# Patient Record
Sex: Male | Born: 1955 | Race: White | Hispanic: No | Marital: Married
Health system: Southern US, Community
[De-identification: ages and names within clinical notes are randomized; demographics above are authoritative.]

---

## 2013-08-30 ENCOUNTER — Inpatient Hospital Stay: Payer: Self-pay | Admitting: Internal Medicine

## 2013-08-30 LAB — CBC WITH DIFFERENTIAL/PLATELET
BASOS ABS: 0 10*3/uL (ref 0.0–0.1)
Basophil %: 0.2 %
EOS ABS: 0 10*3/uL (ref 0.0–0.7)
Eosinophil %: 0.3 %
HCT: 44.9 % (ref 40.0–52.0)
HGB: 15.2 g/dL (ref 13.0–18.0)
LYMPHS PCT: 13.2 %
Lymphocyte #: 1.8 10*3/uL (ref 1.0–3.6)
MCH: 30.7 pg (ref 26.0–34.0)
MCHC: 33.9 g/dL (ref 32.0–36.0)
MCV: 90 fL (ref 80–100)
MONO ABS: 1 x10 3/mm (ref 0.2–1.0)
Monocyte %: 7.6 %
NEUTROS ABS: 10.9 10*3/uL — AB (ref 1.4–6.5)
Neutrophil %: 78.7 %
Platelet: 309 10*3/uL (ref 150–440)
RBC: 4.97 10*6/uL (ref 4.40–5.90)
RDW: 13.9 % (ref 11.5–14.5)
WBC: 13.8 10*3/uL — ABNORMAL HIGH (ref 3.8–10.6)

## 2013-08-30 LAB — URINALYSIS, COMPLETE
BLOOD: NEGATIVE
Bacteria: NONE SEEN
Bilirubin,UR: NEGATIVE
Glucose,UR: NEGATIVE mg/dL (ref 0–75)
Hyaline Cast: 4
KETONE: NEGATIVE
Leukocyte Esterase: NEGATIVE
NITRITE: NEGATIVE
PH: 5 (ref 4.5–8.0)
Protein: NEGATIVE
RBC,UR: 1 /HPF (ref 0–5)
Specific Gravity: 1.019 (ref 1.003–1.030)
Squamous Epithelial: NONE SEEN

## 2013-08-30 LAB — COMPREHENSIVE METABOLIC PANEL
ALBUMIN: 4.5 g/dL (ref 3.4–5.0)
ALK PHOS: 32 U/L — AB
ALT: 22 U/L (ref 12–78)
AST: 14 U/L — AB (ref 15–37)
Anion Gap: 5 — ABNORMAL LOW (ref 7–16)
BUN: 22 mg/dL — ABNORMAL HIGH (ref 7–18)
Bilirubin,Total: 0.5 mg/dL (ref 0.2–1.0)
CALCIUM: 10 mg/dL (ref 8.5–10.1)
Chloride: 103 mmol/L (ref 98–107)
Co2: 30 mmol/L (ref 21–32)
Creatinine: 1.27 mg/dL (ref 0.60–1.30)
Glucose: 114 mg/dL — ABNORMAL HIGH (ref 65–99)
OSMOLALITY: 280 (ref 275–301)
POTASSIUM: 4.3 mmol/L (ref 3.5–5.1)
Sodium: 138 mmol/L (ref 136–145)
Total Protein: 7.8 g/dL (ref 6.4–8.2)

## 2013-08-30 LAB — LIPID PANEL
Cholesterol: 125 mg/dL (ref 0–200)
HDL: 34 mg/dL — AB (ref 40–60)
Ldl Cholesterol, Calc: 66 mg/dL (ref 0–100)
Triglycerides: 125 mg/dL (ref 0–200)
VLDL Cholesterol, Calc: 25 mg/dL (ref 5–40)

## 2013-08-30 LAB — TROPONIN I: Troponin-I: <0.02 ng/mL

## 2013-08-30 LAB — LIPASE, BLOOD: LIPASE: 3477 U/L — AB (ref 73–393)

## 2013-08-30 LAB — HEMOGLOBIN A1C: HEMOGLOBIN A1C: 5.5 % (ref 4.2–6.3)

## 2013-08-31 LAB — COMPREHENSIVE METABOLIC PANEL
ALK PHOS: 26 U/L — AB
ANION GAP: 4 — AB (ref 7–16)
Albumin: 3.5 g/dL (ref 3.4–5.0)
BILIRUBIN TOTAL: 0.4 mg/dL (ref 0.2–1.0)
BUN: 19 mg/dL — ABNORMAL HIGH (ref 7–18)
CHLORIDE: 108 mmol/L — AB (ref 98–107)
Calcium, Total: 9.1 mg/dL (ref 8.5–10.1)
Co2: 27 mmol/L (ref 21–32)
Creatinine: 1.23 mg/dL (ref 0.60–1.30)
GLUCOSE: 87 mg/dL (ref 65–99)
OSMOLALITY: 279 (ref 275–301)
Potassium: 4.3 mmol/L (ref 3.5–5.1)
SGOT(AST): 22 U/L (ref 15–37)
SGPT (ALT): 17 U/L (ref 12–78)
SODIUM: 139 mmol/L (ref 136–145)
TOTAL PROTEIN: 6.5 g/dL (ref 6.4–8.2)

## 2013-08-31 LAB — CBC WITH DIFFERENTIAL/PLATELET
Basophil #: 0.1 10*3/uL (ref 0.0–0.1)
Basophil %: 0.4 %
Eosinophil #: 0.1 10*3/uL (ref 0.0–0.7)
Eosinophil %: 0.6 %
HCT: 39.8 % — ABNORMAL LOW (ref 40.0–52.0)
HGB: 13.7 g/dL (ref 13.0–18.0)
LYMPHS ABS: 2.3 10*3/uL (ref 1.0–3.6)
LYMPHS PCT: 18.1 %
MCH: 30.9 pg (ref 26.0–34.0)
MCHC: 34.3 g/dL (ref 32.0–36.0)
MCV: 90 fL (ref 80–100)
MONOS PCT: 8.4 %
Monocyte #: 1.1 x10 3/mm — ABNORMAL HIGH (ref 0.2–1.0)
Neutrophil #: 9.1 10*3/uL — ABNORMAL HIGH (ref 1.4–6.5)
Neutrophil %: 72.5 %
Platelet: 244 10*3/uL (ref 150–440)
RBC: 4.42 10*6/uL (ref 4.40–5.90)
RDW: 13.7 % (ref 11.5–14.5)
WBC: 12.6 10*3/uL — ABNORMAL HIGH (ref 3.8–10.6)

## 2013-08-31 LAB — LIPASE, BLOOD: Lipase: 3826 U/L — ABNORMAL HIGH (ref 73–393)

## 2013-08-31 LAB — TROPONIN I: Troponin-I: <0.02 ng/mL

## 2013-09-01 LAB — LIPID PANEL
CHOLESTEROL: 95 mg/dL (ref 0–200)
HDL Cholesterol: 30 mg/dL — ABNORMAL LOW (ref 40–60)
Ldl Cholesterol, Calc: 40 mg/dL (ref 0–100)
Triglycerides: 123 mg/dL (ref 0–200)
VLDL Cholesterol, Calc: 25 mg/dL (ref 5–40)

## 2013-09-01 LAB — CANCER ANTIGEN 19-9: CA 19 9: 6 U/mL (ref 0–35)

## 2013-09-01 LAB — LIPASE, BLOOD: LIPASE: 244 U/L (ref 73–393)

## 2013-09-02 LAB — OCCULT BLOOD X 1 CARD TO LAB, STOOL: OCCULT BLOOD, FECES: NEGATIVE

## 2013-09-03 LAB — PLATELET COUNT: Platelet: 260 10*3/uL (ref 150–440)

## 2013-09-11 LAB — PATHOLOGY REPORT

## 2014-03-30 ENCOUNTER — Ambulatory Visit: Payer: Self-pay | Admitting: Unknown Physician Specialty

## 2014-04-04 LAB — PATHOLOGY REPORT

## 2014-05-07 ENCOUNTER — Ambulatory Visit: Payer: Self-pay | Admitting: Internal Medicine

## 2014-05-21 ENCOUNTER — Ambulatory Visit: Payer: Self-pay | Admitting: Internal Medicine

## 2014-08-22 ENCOUNTER — Other Ambulatory Visit: Payer: Self-pay | Admitting: Neurosurgery

## 2014-08-22 DIAGNOSIS — M5416 Radiculopathy, lumbar region: Secondary | ICD-10-CM

## 2014-11-03 NOTE — Discharge Summary (Signed)
PATIENT NAME:  Sean Braun, Sean Braun MR#:  [id number] DATE OF BIRTH:  1955/11/04  DATE OF ADMISSION:  08/30/2013  DATE OF DISCHARGE:  09/03/2013  DISCHARGE DIAGNOSES: 1.  Abdominal pain secondary to acute pancreatitis, resolved.  2.  The patient has abdominal pain likely secondary to gastritis and duodenal ulcer as well, resolved.  3.  Hypertension.  4.  Hyperlipidemia.  5.  Coronary artery disease, CABG. 6.  History of sciatica.   DISCHARGE MEDICATIONS:  1.  Trilipix 135 mg p.o. daily.  2.  Lisinopril 15 mg p.o. daily.  3.  Zocor 20 mg p.o. daily.  4.  Atenolol 50 mg p.o. daily.  5.  Fish oil 1 gram p.o. daily.  6.  Sucralfate 1 gram p.o. 4 times a day.  7.  Protonix 40 mg p.o. b.i.d.   Sucralfate and Protonix are new medications. Advised to stop aspirin and other NSAID-containing products.   DIET: Low-sodium, low-fat diet.   CONSULTATIONS: GI consult with Dr. Bluford Kaufmannh and also Dr. Servando SnareWohl.    LAB DATA: EKG on admission showed normal sinus rhythm with no ST-T changes. Hemoglobin A1c 5.5. The patient's triglycerides were 125. Lipase was 3477 on admission. Abdominal ultrasound was normal. Lipase improved to normal on February 20. Abdominal CAT scan showed edema of the second portion of duodenum, with duodenitis.   HOSPITAL COURSE:  The patient is a [age] year old male with history of coronary artery disease and CABG. Came in because of abdominal pain, which was worse after eating. The patient found to have acute pancreatitis. The patient was kept n.p.o., started on IV fluids, and patient did get IV Zofran and seen by GI. The patient's ultrasound and CT abdomen were unremarkable. The patient's lipase improved, and pancreatitis resolved the next day. The patient's CAT scan showed duodenitis, so he had an EGD, which showed multiple duodenal ulcers and gastritis. Started on IV PPI, and then changed to p.o. PPI and (carafate. The patient was advised to stop Advil and also other NSAID products, and she is  tolerating the diet and has no more abdominal pain. The patient is stable for discharge today.   VITAL SIGNS:  Temperature 97.5, heart rate 55, blood pressure 130/80, sat 94% on room air.   Time spent on discharge preparation: More than 30 minutes.   The patient has no primary doctor, as he [place] from [place] recently. I advised him to get a primary doctor as soon as possible.    ____________________________ Katha HammingSnehalatha Derral Colucci, MD sk:mr D: 09/03/2013 13:48:40 ET T: 09/03/2013 21:00:41 ET JOB#: [id number]  cc: Katha HammingSnehalatha Jamond Neels, MD, <Dictator> Katha HammingSNEHALATHA Wonder Donaway MD ELECTRONICALLY SIGNED 09/07/2013 14:05

## 2014-11-03 NOTE — Consult Note (Signed)
PATIENT NAME:  Sean FramesGNANDT, Jshaun J MR#:  [id number] DATE OF BIRTH:  October 05, 1955  DATE OF CONSULTATION:  08/31/2013  REFERRING PHYSICIAN:  Katharina Caperima Vaickute, MD CONSULTING PROVIDERS:  Lynnae Prudeobert Elliott, MD, and Ranae PlumberKimberly A. Arvilla MarketMills, ANP (Adult Nurse Practitioner)  REASON FOR CONSULTATION: Pancreatitis, unclear etiology.   HISTORY OF PRESENT ILLNESS: This 59 year old patient has a history of hypertension, hyperlipidemia as well as coronary artery disease, and presented with a 2-day history of upper abdomen pain. It started out 2/10 by intensity and worsened to 10/10 by intensity. He describes this as an achiness radiating all over the abdomen, usually associated with eating. He had nausea without vomiting. He has passed green stools, no blood or melena noted. He denies fevers or chills.   In the Emergency Room, he was found to have a lipase greater than 3000. He denies any alcohol abuse or  use. No history of gallbladder disease. Liver enzymes were normal. Abdominal ultrasound showed no bile duct dilatation. Triglycerides levels were normal. GI has been asked to see the patient regarding further evaluation and management.   The patient does take Advil at night. He has done this chronically for years. Typically he has no heartburn or indigestion complaint.   PAST MEDICAL HISTORY:  1.  CAD status post CABG.  2.  Hypertension.  3.  Hyperlipidemia.  4.  Hypertriglyceridemia.   MEDICATIONS FROM HOME:  1.  Aspirin 81 mg daily.  2.  Atenolol 50 mg daily.  3.  Fish oil 1 g once daily.  4.  Lisinopril 50 mg daily.  5.  Trilipix   35 mg daily.  6.  Vitamin C 500 mg daily.  7.  Zocor 10 mg at bedtime.   PAST SURGICAL HISTORY:  1.  L4-L5 lower back surgery, diskectomy as well as laminectomy in the remote past with resolving sciatica in the right lower extremity.  2.  Coronary artery bypass graft, two stents placed status post MI in 1999.  3.  Chest pain, 2008. Cardiac cath with two more blockages. Underwent CABG.  Triple bypass performed in 2008.   ALLERGIES: NKDA.   FAMILY HISTORY: Positive for heart disease, hypertension, and diabetes. A mother deceased with COPD complications. Father had heart disease.   SOCIAL HISTORY: The patient is married, lives with his wife. Family is in [place]. Negative tobacco, past or present or alcohol, past or present.   REVIEW OF SYSTEMS: Positive for GI symptoms as noted. He does have chronic sciatica pain for which he takes NSAIDs.   PHYSICAL EXAMINATION:  VITAL SIGNS: stable GENERAL: Well-developed, well-nourished, large-framed Caucasian male sitting in bed, NAD. HEENT: Default negative.  NECK: Supple. Trachea midline.  HEART: Heart tones S1, S2 without murmur, rub, or gallop.  LUNGS: CTA. Respirations are nonlabored.  ABDOMEN: Soft, protuberant. Positive tenderness, right upper quadrant and epigastric area. This is significant tenderness and was not able to palpate for hepatosplenomegaly.  RECTAL: Deferred.  MUSCULOSKELETAL: Moves all extremities. Gait not evaluated.  SKIN: Warm and dry without rash.  NEUROLOGIC: Cranial nerves II through XII grossly intact. The patient is a good historian.  PSYCHIATRIC: Affect and mood within normal.   LABORATORIES AND STUDIES: Admission blood work with glucose 114, BUN 22, creatinine 1.27. Lipase 3477, today 3826. Albumin 4.5, alkaline phosphatase 32, AST 14, ALT 22. Troponin less than 0.02. WBC 13.8, hemoglobin 15.2 with hematocrit 44.9. Hematocrit down 39.8 today. WBC 12.6.   Laboratory study, 08/31/2013: Today shows creatinine is 1.23, lipase 3826. Liver panel unchanged. WBC 12.6, hematocrit 39.8, which  is good.   URINALYSIS: Clear, yellow, positive hyaline casts. Negative for blood or ketones.   RADIOLOGY: CT of the abdomen and pelvis with contrast performed 08/31/2013 shows prominent edema in the second portion of the duodenum with very subtle soft-tissue stranding around the uncinate process of the pancreas. There is no  definable ulceration. The remainder of the pancreas is normal. The liver and biliary tree are normal. Spleen, adrenal glands, and kidneys are normal except for a 12 mm cyst in the left kidney. Lung bases are clear.   IMPRESSION: Acute right upper quadrant pain with epigastric distribution with elevated lipase suggesting pancreatitis. CT study does show abnormalities around the duodenum and this duodenitis could also represent a duodenal ulcer. He has chronic nonsteroidal antiinflammatory  use. Normal abdominal ultrasound. No gallstones or abnormal bile ducts.   PLAN: Etiology shows pancreatitis in a life-long nonalcohol user with adequate lipid panel. Suggest rule out duodenal ulcer or  erosion. Would recommend EGD in the morning. If negative, consider EUS to rule out pancreatic malignancy. Dr. Mechele Collin does recommend CA19-9 today.   It is possible to have a gallstone come and go without biliary abnormalities, so gallstone pancreatitis is still potentially on the list. There is concern for duodenal ulcer.   These services provided by Amedeo Kinsman, ANP, under collaborative agreement with Dr. Lynnae Prude.   Thank you for the consultation.    ____________________________ Ranae Plumber Arvilla Market, ANP (Adult Nurse Practitioner) kam:np D: 09/26/2013 13:44:48 ET T: 09/26/2013 15:39:06 ET JOB#: [id number]  cc: Cala Bradford A. Arvilla Market, ANP (Adult Nurse Practitioner), <Dictator> Ranae Plumber Suzette Battiest, MSN, ANP-BC Adult Nurse Practitioner ELECTRONICALLY SIGNED 09/26/2013 17:39

## 2014-11-03 NOTE — Consult Note (Signed)
Consult done by NP Fransico SettersKim Mills, discussed with her, I also saw the patient. Plan for EGD tomorrow due to CT showing ulcerated duodenum.  May be due to nocturnal Advil at bed time.    Electronic Signatures: Scot JunElliott, Chyane Greer T (MD)  (Signed on 19-Feb-15 16:42)  Authored  Last Updated: 19-Feb-15 16:42 by Scot JunElliott, Wesson Stith T (MD)

## 2014-11-03 NOTE — Consult Note (Signed)
Brief Consult Note: Diagnosis: Acute RUQ/epi pain with pancreatitis, chronic NSAID, Duodentitis on CT, normal abd US-  no gallstones or abnormal bile ducts.   Patient was seen by consultant.   Consult note dictated.   Comments: Etiology of acute pancreatitis in a lifelong non alcohol user to r/o duo ulcer/erosion. Perform EGD in am. If negative, consider EUS to r/o pancareatic malignancy. Obtain CA 19-9 today. It is possible to have a gallstone come and go without biliary abnormality. Check fasting lipids. This case was discussed with Dr. Mechele CollinElliott in collaboration of care.  Electronic Signatures: Sean Braun, Sean Braun (NP)  (Signed 19-Feb-15 16:14)  Authored: Brief Consult Note   Last Updated: 19-Feb-15 16:14 by Sean Braun, Ananias Kolander Braun (NP)

## 2014-11-03 NOTE — Consult Note (Signed)
Pt with scattered erosions in antrum, severe ulcerations in mid and distal duodenal bulb.  Likely he passed a stone causing lipase to fall dramaticly.  Needs 1-2 days more in hospital with carafate and PPI and clear liq diet which can be advanced to full liquids after 24 hours or so.  Dr. Servando SnareWohl on call this weekend for me.  Bx done of stomach for H, pylori.  Electronic Signatures: Scot JunElliott, Robert T (MD)  (Signed on 20-Feb-15 13:56)  Authored  Last Updated: 20-Feb-15 13:56 by Scot JunElliott, Robert T (MD)

## 2014-11-03 NOTE — Consult Note (Signed)
Chief Complaint:  Subjective/Chief Complaint Patient with no complaints. S/P EGD with duodenal ulcer and pancreatitis. He was taking NSAID's prior to admission. No sign of any GI bleeding.   VITAL SIGNS/ANCILLARY NOTES: **Vital Signs.:   21-Feb-15 05:20  Vital Signs Type Routine  Temperature Temperature (F) 97.6  Celsius 36.4  Temperature Source oral  Pulse Pulse 63  Respirations Respirations 17  Systolic BP Systolic BP 119  Diastolic BP (mmHg) Diastolic BP (mmHg) 68  Mean BP 85  Pulse Ox % Pulse Ox % 94  Pulse Ox Activity Level  At rest  Oxygen Delivery Room Air/ 21 %   Brief Assessment:  GEN well developed, well nourished   Respiratory normal resp effort   Additional Physical Exam Alert and orientated times 3   Lab Results: Routine Chem:  20-Feb-15 06:20   Lipase 244 (Result(s) reported on 01 Sep 2013 [facility]:52[facility].)  Cholesterol, Serum 95  Triglycerides, Serum 123  HDL (INHOUSE)  30  VLDL Cholesterol Calculated 25  LDL Cholesterol Calculated 40 (Result(s) reported on 01 Sep 2013 at 06:57AM.)   Assessment/Plan:  Assessment/Plan:  Assessment Duodenal ulcer with abdominal pain.   Plan Patient is feelin gmuch better. Continue supportive care. No added recommendations.   Electronic Signatures: Midge MiniumWohl, Lavarr President (MD)  (Signed 21-Feb-15 12:46)  Authored: Chief Complaint, VITAL SIGNS/ANCILLARY NOTES, Brief Assessment, Lab Results, Assessment/Plan   Last Updated: 21-Feb-15 12:46 by Midge MiniumWohl, Lyris Hitchman (MD)

## 2014-11-03 NOTE — H&P (Signed)
PATIENT NAME:  Sean Braun, TORREZ MR#:  [id number] DATE OF BIRTH:  08/28/1955  DATE OF ADMISSION:  08/30/2013  PRIMARY CARE PHYSICIAN: None.  CARDIOLOGIST: Dr. Lady Gary.   HISTORY OF PRESENT ILLNESS: The patient is a 59 year old Caucasian male with history significant for history of hypertension, hyperlipidemia as well as coronary disease, who presents to the hospital with complaints of abdominal pain. According to the patient, he was doing well up until approximately two days ago when he started having pain on Monday night in his upper abdomen. The pain started in the epigastrium, was 2/10 by intensity, worsened to 10/10 by intensity. Pain was described as achiness, radiating all over his upper abdomen, increasing whenever he eats.   The patient was also nauseated but did not have any vomiting. He admits of a green stool over the past two days but no light-colored stool. He denies any high fever or chills. He denies any chest pains.   He presented to the Emergency Room for further evaluation and he was noted to have elevation of lipase level above 3000 and hospitalist services were contacted for admission. He denies any alcohol abuse, in fact does not drink any alcohol. He denied any gallbladder disease in the past.   He underwent ultrasound of his abdomen here in the Emergency Room which was unremarkable. His liver enzymes were also normal. Lipid panel was performed and revealed triglyceride level normal at 125.   PAST MEDICAL HISTORY: Significant for history of coronary artery disease status post coronary artery bypass grafting, history of hypertension, hyperlipidemia, hypertriglyceridemia.   MEDICATIONS: Aspirin 81 mg p.o. daily, atenolol 50 mg p.o. daily, fish oil 1 g once daily, lisinopril 15 mg p.o. daily, Trilipix 135 mg p.o. daily, vitamin C 500 mg p.o. daily, and Zocor 10 mg p.o. at bedtime   PAST SURGICAL HISTORY: L4-L5 lower back surgery, diskectomy as well as laminectomy  in remote past with  resultant sciatica into right lower extremity, coronary artery disease status post MI in 1999. Two stents were placed at that time. The patient again experienced some chest pains in 2008. He had cardiac catheterization done which additional 2 blockages were found and he underwent open heart surgery. Triple bypass surgery was performed in 2008.   ALLERGIES: None.   FAMILY HISTORY: The patient denies any early coronary artery disease, hypertension, hyperlipidemia, diabetes mellitus, or cancers. The patient's mother was a smoker. She died of COPD complications. The patient's father was also a smoker who had stroke, also had carotid artery disease.   SOCIAL HISTORY: The patient is married, lives with his wife. He has 3 children total and 4  grandchildren. All of them are in [place] surrounding states. No smoking current or prior. No alcohol abuse. He works as a Naval architect for [employer].   REVIEW OF SYSTEMS: Positive for pains in his abdomen. Bifocal glasses. Some snoring but no obstructive sleep apnea. Denies any chest pains. Denies any presyncopal episodes but admits of abdominal pain and admits of nausea. Denies any fevers, chills, fatigue, weakness, weight loss or gain.  EYES: Denies any blurry vision, double vision, glaucoma or cataracts.  ENT: Denies any tinnitus, allergies, epistaxis, sinus pain, dentures, difficulty swallowing.  RESPIRATORY: Denies cough, wheezes, asthma, COPD. CARDIOVASCULAR:  Denies orthopnea, edema, arrhythmias, or chest pains or palpitations. GASTROINTESTINAL: Denies vomiting, rectal bleeding, change in bowel habits. GENITOURINARY: Denies dysuria, hematuria, frequency, or incontinence.  ENDOCRINE: Denies polydipsia, nocturia, thyroid problems, heat or cold intolerance or thirst.  HEMATOLOGIC: Denies anemia, easy  bruising, bleeding or swollen glands.  SKIN: Denies any acne, rash, lesions or change in moles.  MUSCULOSKELETAL: Denies arthritis, cramps, swelling, or  gout.  NEUROLOGIC: Denies any numbness, epilepsy, tremors.  PSYCHIATRIC: Denies anxiety, insomnia, depression.   PHYSICAL EXAMINATION:  VITAL SIGNS: On arrival to the hospital, temperature is 98, pulse of 70. Respiratory rate was 20, blood pressure 129/82. Saturation was 97% on room air.  GENERAL: A well-developed, well-nourished Caucasian male in no significant distress, comfortable on stretcher.  HEENT: Pupils are equal and reactive to light. Extraocular movements intact. No icterus or conjunctivitis. Normal hearing. No pharyngeal edema. Mucosa is moist.   NECK: Supple, nontender. Thyroid is not enlarged. No adenopathy. No JVD or carotid bruit. Full range of motion.  LUNGS: Clear to auscultation in all fields. No rales, rhonchi, diminished breath sounds, or wheezing. No labored inspirations, increased effort, dullness to percussion or respiratory distress.  CARDIOVASCULAR: S1, S2 appreciated. No murmurs, rubs, or gallops. PMI not lateralized. Chest is not tender to palpation with 1+ pedal pulses. No lower extremity edema, calf tenderness or cyanosis was noted.  ABDOMEN: Soft, tender in right upper quadrant but no rebound or guarding were noted. No Eulah Pont sign was noted. The patient was also tender in the epigastric area. Again, so significant tenderness. No localized tenderness was noted however. Somewhat diffuse epigastric discomfort. No hepatosplenomegaly or masses were noted. RECTAL: Deferred.  MUSCULOSKELETAL: Able to move all extremities. No cyanosis, degenerative joint disease or kyphosis. Gait not tested.  SKIN: Did not reveal any rashes, lesions, erythema, nodularity or induration. It was warm and dry to palpation.  LYMPHATIC: No adenopathy in the cervical region.  NEUROLOGICAL: Cranial nerves grossly intact. Sensory is intact. No dysarthria or aphasia.  PSYCHIATRIC: Alert and oriented to time, person and place. Cooperative. Memory is good. No significant confusion, agitation or depression  noted.   LABORATORIES: BMP showed glucose 114, BUN was 22. Otherwise BMP was unremarkable. Liver enzymes were within normal limits. The patient's cholesterol, LDL was 66, total cholesterol was 125, triglycerides 125, HDL was 34, lipase level elevated at 3477. Troponin was less than 0.02. White blood cell count was 13.8. Hemoglobin 15.2, platelet count 309. Absolute neutrophil count was 10.9. Urinalysis was remarkable for 4 white blood cells.   EKG: Normal sinus rhythm at 63 beats per minute, normal axis, minimal voltage criteria for LVH, QS in V1, V2, septal infarct, age indeterminate, also inferior infarct with QS in III and aVF. T depressions in the inferior leads but no acute ST-T changes were noted.   RADIOLOGIC STUDIES: Ultrasound of abdomen, limited survey, 18th of February 2015, showed normal right upper quadrant ultrasound.   ASSESSMENT AND PLAN:  1.  Acute pancreatitis of unclear etiology at this time. The patient did have some green-colored  stool and questionable he may have a temporary obstruction of his biliary trees; however, the patient's ultrasound of gallbladder is unremarkable. His triglyceride level was also unremarkable controlled with medications. We will get gastroenterologist involved. We will continue the patient on IV fluids, pain medications as needed as well as keeping him n.p.o. We will check lipase level in the morning.  2.  Hypertension. We will continue home medications but blood pressure is well controlled.  3.  Hyperlipidemia. We will continue home medications. Lipid panel was observed. Triglycerides were well controlled.  4.  Hyperglycemia. Get hemoglobin A1c.   TIME SPENT: 50 minutes on this patient.   ____________________________ Katharina Caper, MD rv:np D: 08/30/2013 21:30:10 ET T: 08/30/2013 22:13:10 ET  JOB#: [id number]  cc: Katharina Caperima [place] Souter, MD, <Dictator> Bianney Rockwood MD ELECTRONICALLY SIGNED 09/14/2013 18:16

## 2014-11-03 NOTE — Discharge Summary (Signed)
PATIENT NAME:  Sean Braun, Sean Braun MR#:  [id number] DATE OF BIRTH:  February 16, 1956  DATE OF ADMISSION:  08/30/2013 DATE OF DISCHARGE:  09/03/2013  PRIMARY DOCTOR:  None.  CARDIOLOGY:  Darlin Priestly. Lady Gary, MD  DISCHARGE DIAGNOSES: 1.  Abdominal pain secondary to acute pancreatitis, resolved.  2.  Gastritis and duodenal ulcers.  3.  Hypertension.  4.  Hyperlipidemia.  5.  Coronary artery disease, status post coronary artery bypass graft.     CONSULTATIONS: GI consult with Dr. Mechele Collin and Dr. Servando Snare.   HOSPITAL COURSE: A 59 year old male with history of hypertension, hyperlipidemia, coronary artery disease, came in because of abdominal pain. The patient had abdominal pain, 10 out of 10 in severity, in the epigastric region. The patient's pain got worse with eating and also had nausea. The patient's lipase was 3000 on admission. The patient has no history of alcohol abuse and the patient was admitted to the hospitalist service for acute pancreatitis. The patient was kept n.p.o., IV fluids were started. The patient's vitals were stable on admission. The patient's labs showed a BUN of 22 and other LFTs were also normal. The patient's white count was 13.8. The patient's ultrasound showed normal right upper quadrant ultrasound.  The patient was thought to have acute pancreatitis, probably due to temporary biliary obstruction and the patient was kept n.p.o. We followed serial lipase levels. The patient's initial lipase was 3477 and the next lipase the following day was 3826, then it normalized to 244 on February 20th. The patient's CT abdomen showed edema in the second portion of deodorant consistent with duodenitis and there is stranding around the uncinate process of the pancreas with pancreatic inflammation. The patient's ultrasound showed normal right upper quadrant and the CA 99 was normal. Seen by GI, Dr. Servando Snare, and the patient's lft s> were normal range. The patient improved in terms of his symptoms. Also, the  patient also had an upper endoscopy because of abdominal pain and duodenitis seen on the CAT scan.  The patient was found to have gastritis and multiple duodenal ulcers with clean base and normal second part of duodenum.  He was continued on PPIs and Carafate and started on clear liquids, advanced diet to full liquids yesterday and the patient tolerated the diet and has no abdominal pain. The patient advised to avoid aspirin, other NSAID-containing products like ibuprofen and Advil, and the patient is on p.o. Protonix and Carafate at discharge. He will see Dr. Mechele Collin as an outpatient for followup of his duodenal ulcers.   DISCHARGE MEDICATIONS:   1.  Zocor 20 mg p.o. daily.  2.  Atenolol 50 mg p.o. daily.  3.  Trilipix 135 mg p.o. daily.  4.  Fish oil 1 gram p.o. daily.  5.  Sucralfate 1 gram p.o. 4 times a day.  6.  Protonix 40 mg p.o. b.i.d.   7.  The patient advised to avoid NSAID-containing products.   DIET:  Low fat, low sodium diet.   The patient is given prescription for Protonix and sucralfate.   The patient says that he had history of back surgeries and with sciatic pain on the right leg. Does take ibuprofen on a daily basis, 1 tablet, to relieve his pain after his work. Advised him not to take it anymore and see a local orthopedic physician. The patient's creatinine is stable during the hospital stay and his LFTs were also normal.   TIME SPENT ON DISCHARGE PREPARATION: More than 30 minutes.   ____________________________ Katha Hamming, MD sk:cs  D: 09/03/2013 09:29:00 ET T: 09/03/2013 18:03:38 ET JOB#: [id number]  cc: Katha HammingSnehalatha Eun Vermeer, MD, <Dictator> Katha HammingSNEHALATHA Euleta Belson MD ELECTRONICALLY SIGNED 09/07/2013 14:00

## 2014-11-06 ENCOUNTER — Ambulatory Visit
Admit: 2014-11-06 | Disposition: A | Discharge status: Home / Self Care | Payer: Self-pay | Attending: Internal Medicine | Admitting: Internal Medicine

## 2017-11-16 ENCOUNTER — Other Ambulatory Visit: Payer: Self-pay | Admitting: Internal Medicine

## 2017-11-16 DIAGNOSIS — R1011 Right upper quadrant pain: Secondary | ICD-10-CM

## 2017-11-22 ENCOUNTER — Ambulatory Visit
Admission: RE | Admit: 2017-11-22 | Discharge: 2017-11-22 | Disposition: A | Discharge status: Home / Self Care | Payer: BLUE CROSS/BLUE SHIELD | Source: Ambulatory Visit | Attending: Internal Medicine | Admitting: Internal Medicine

## 2017-11-22 DIAGNOSIS — R1011 Right upper quadrant pain: Secondary | ICD-10-CM | POA: Insufficient documentation

## 2019-05-10 ENCOUNTER — Other Ambulatory Visit: Payer: Self-pay | Admitting: Physical Medicine and Rehabilitation

## 2019-05-10 DIAGNOSIS — M5416 Radiculopathy, lumbar region: Secondary | ICD-10-CM

## 2019-05-22 ENCOUNTER — Other Ambulatory Visit: Payer: Self-pay | Admitting: Physical Medicine and Rehabilitation

## 2019-05-22 ENCOUNTER — Other Ambulatory Visit (HOSPITAL_COMMUNITY): Payer: Self-pay | Admitting: Physical Medicine and Rehabilitation

## 2019-05-22 DIAGNOSIS — M5416 Radiculopathy, lumbar region: Secondary | ICD-10-CM

## 2019-05-22 DIAGNOSIS — M5441 Lumbago with sciatica, right side: Secondary | ICD-10-CM

## 2019-05-22 DIAGNOSIS — M5136 Other intervertebral disc degeneration, lumbar region: Secondary | ICD-10-CM

## 2019-05-31 ENCOUNTER — Other Ambulatory Visit: Payer: Self-pay

## 2019-05-31 ENCOUNTER — Ambulatory Visit (HOSPITAL_COMMUNITY)
Admission: RE | Admit: 2019-05-31 | Discharge: 2019-05-31 | Disposition: A | Discharge status: Home / Self Care | Payer: BC Managed Care – PPO | Source: Ambulatory Visit | Attending: Physical Medicine and Rehabilitation | Admitting: Physical Medicine and Rehabilitation

## 2019-05-31 DIAGNOSIS — M5416 Radiculopathy, lumbar region: Secondary | ICD-10-CM

## 2019-05-31 DIAGNOSIS — M5441 Lumbago with sciatica, right side: Secondary | ICD-10-CM

## 2019-05-31 DIAGNOSIS — M5136 Other intervertebral disc degeneration, lumbar region: Secondary | ICD-10-CM

## 2020-07-03 IMAGING — MR MR LUMBAR SPINE W/O CM
4 of 5 series · 18 of 48 positions shown · non-contrast
Comparison: 0138

CLINICAL DATA: Low back pain radiating into right leg

EXAM:
MRI LUMBAR SPINE WITHOUT CONTRAST
TECHNIQUE: Multiplanar, multisequence MR imaging of the lumbar spine was
performed. No intravenous contrast was administered.

[Series 2: T2 · sagittal · 4.0mm · 0.59mm/px · 6 of 17 slices shown (1 of 2)]
[im 1/17]
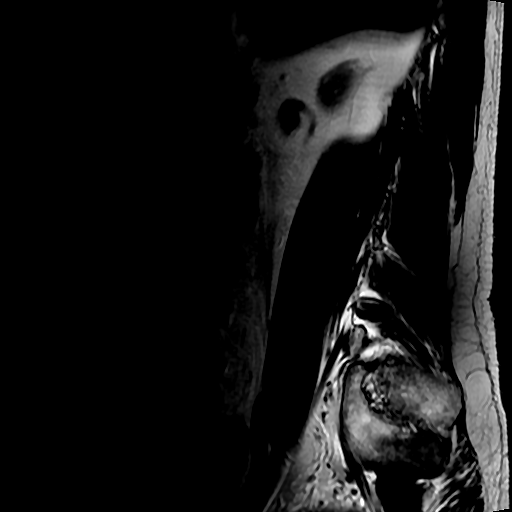
[im 4/17]
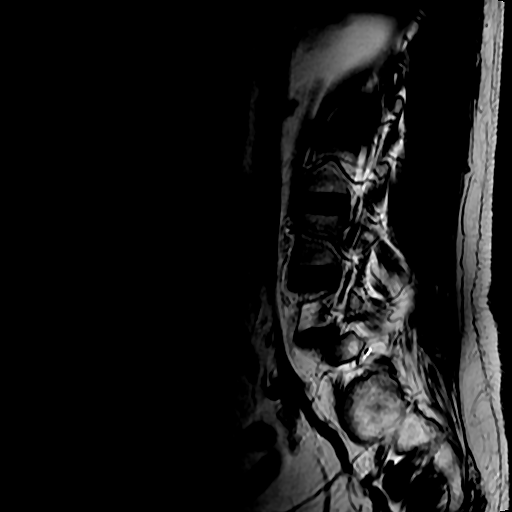
[im 7/17]
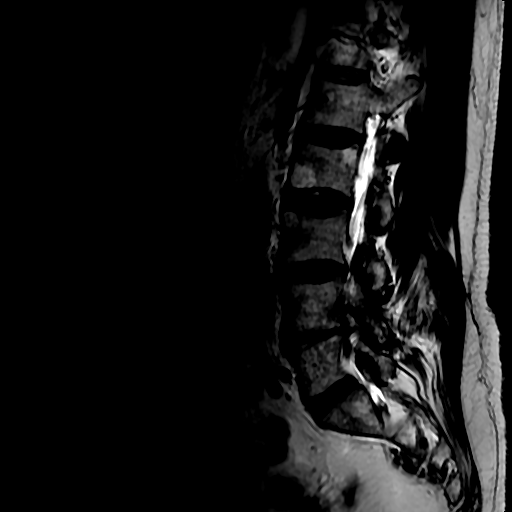
[im 10/17]
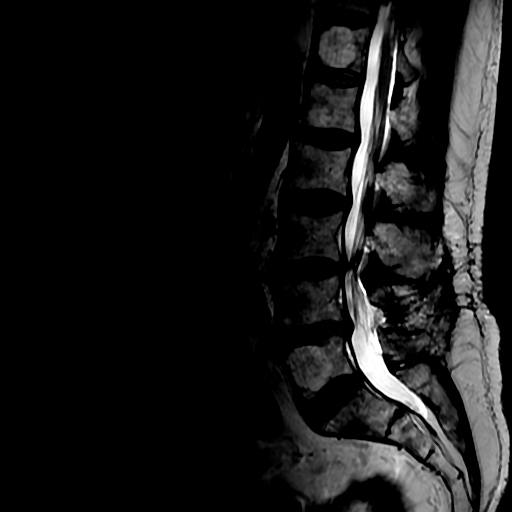
[im 13/17]
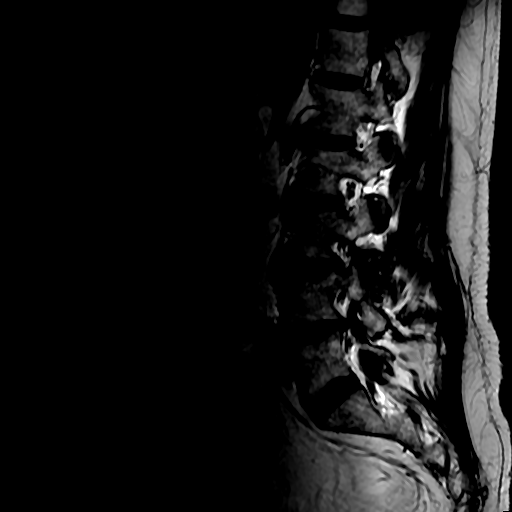
[im 17/17]
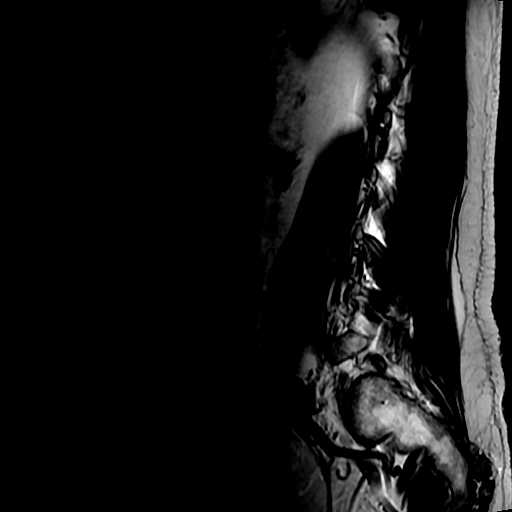

[Series 4: T1 · sagittal · 4.0mm · 0.59mm/px · 3 of 17 slices shown (1 of 2)]
[im 1/17]
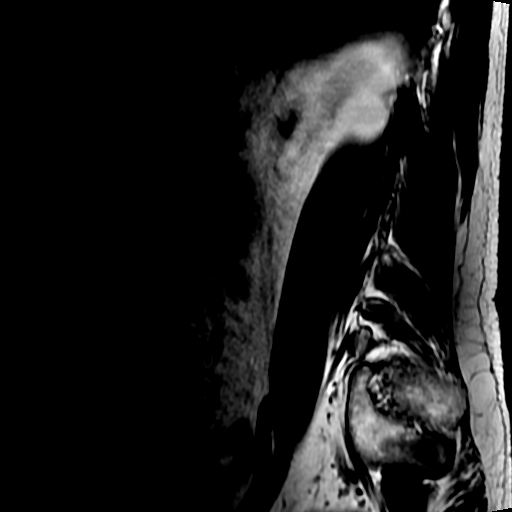
[im 9/17]
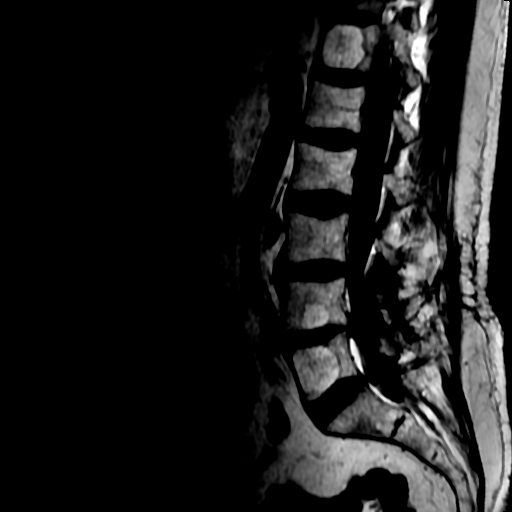
[im 17/17]
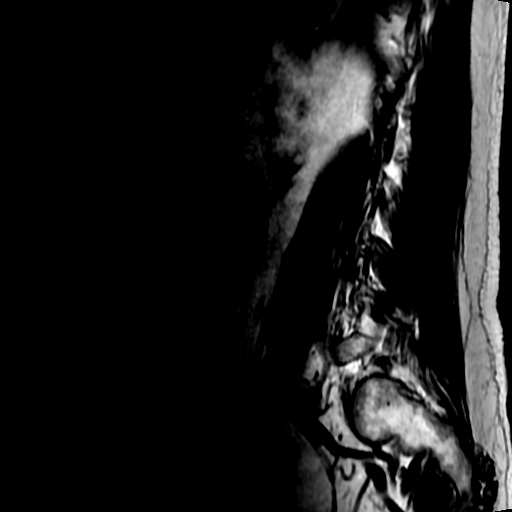

[Series 5: T2 · axial · 4.0mm · 0.39mm/px · z∈[-75,+135]mm · 6 of 53 slices shown (2 of 2)]
[im 4/53]
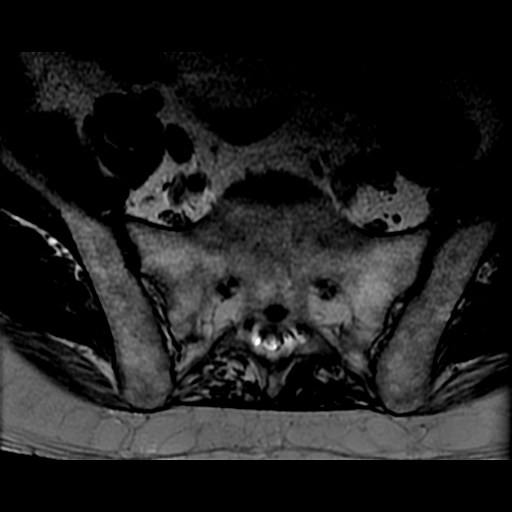
[im 7/53]
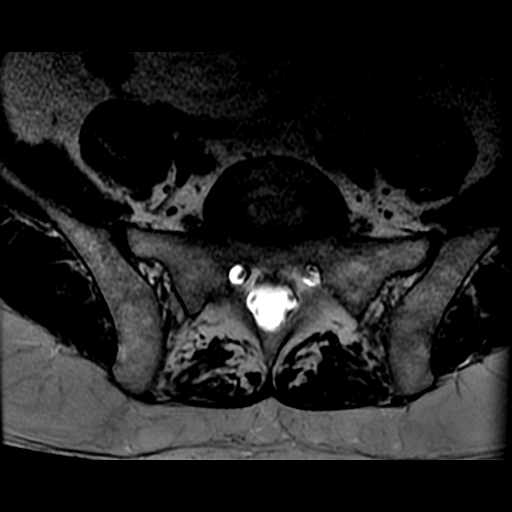
[im 11/53]
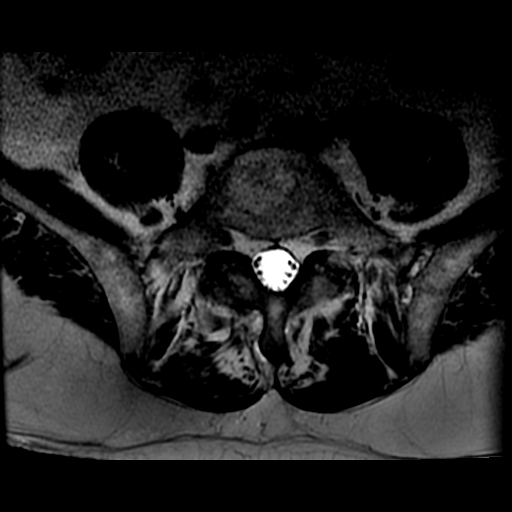
[im 18/53]
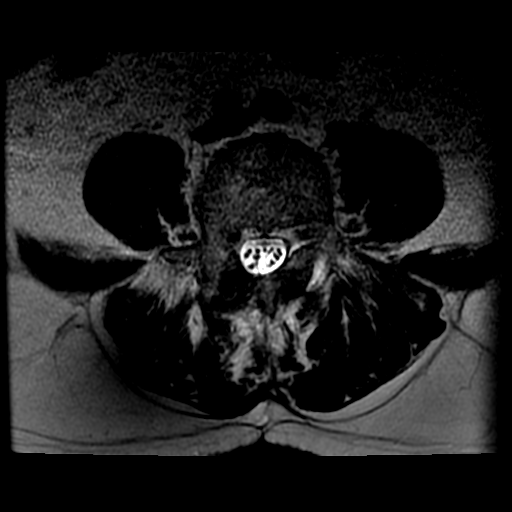
[im 28/53]
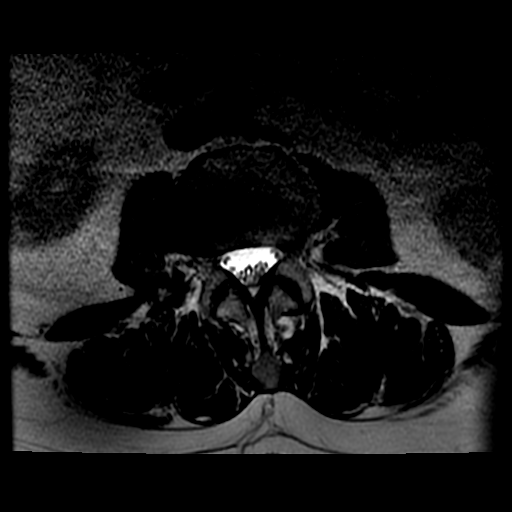
[im 46/53]
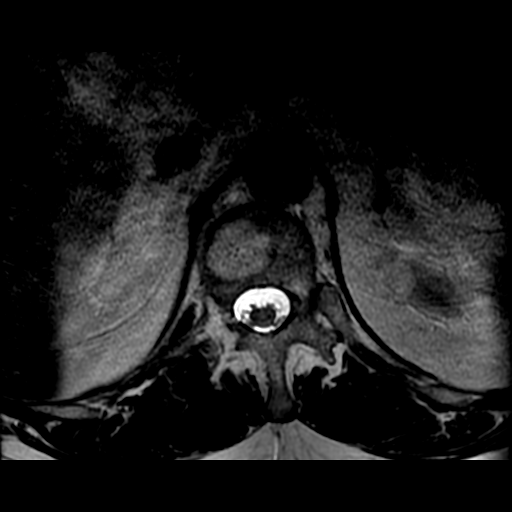

[Series 6: T1 · axial · 4.0mm · 0.39mm/px · z∈[-60,+135]mm · 3 of 53 slices shown (2 of 2)]
[im 7/53]
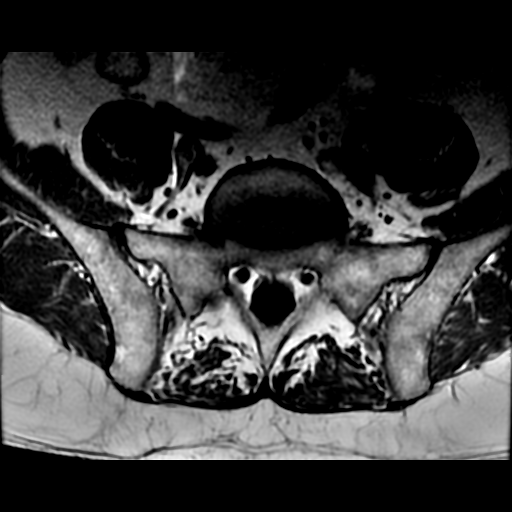
[im 28/53]
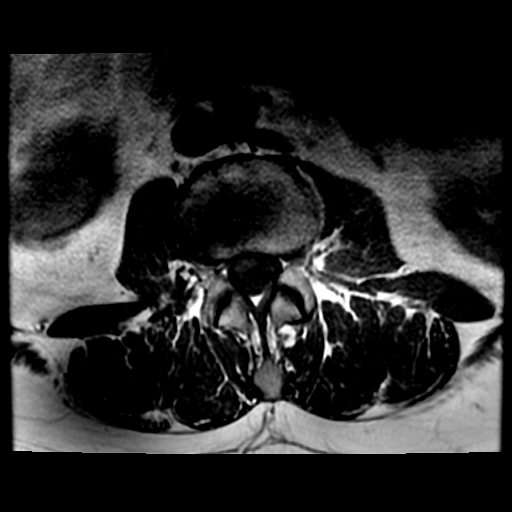
[im 46/53]
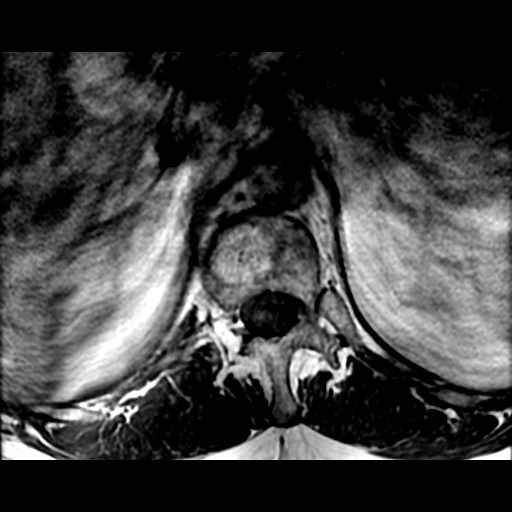

[18 of 48 positions shown; findings below may reference images not displayed]

FINDINGS: Segmentation:  Standard.

Alignment:  Stable vertebral body alignment.

Vertebrae: Vertebral body heights are preserved. There is a new
Schmorl's node at the superior endplate of L4 with adjacent marrow
edema. Multiple vertebral body hemangiomas are again identified,
largest at T12 and L5.

Conus medullaris and cauda equina: Conus extends to the L1-L2 level.
Conus and cauda equina appear normal.

Paraspinal and other soft tissues: Chronic postoperative changes.
Otherwise unremarkable.

Disc levels:

L1-L2:  Minor disc bulge.  No canal or foraminal stenosis.

L2-L3:  No canal or foraminal stenosis.

L3-L4: Increased disc bulge and facet arthropathy with ligamentum
flavum infolding. New mild to moderate canal stenosis with partial
effacement of the lateral recesses. No right foraminal stenosis. New
minor left foraminal stenosis.

L4-L5: Evidence of prior posterior decompression. Disc bulge with
endplate osteophytic ridging eccentric to the right and facet
arthropathy. The right subarticular disc protrusion has decreased in
size. No canal stenosis. Decreased right lateral recess stenosis
without definite traversing nerve root compression at this time.
Stable to slightly increased moderate to marked right foraminal
stenosis. Stable mild left foraminal stenosis.

L5-S1:  Mild facet arthropathy.  No canal or foraminal stenosis.
IMPRESSION: Multilevel degenerative changes as detailed above. Notable findings
include progression at L3-L4 without high-grade stenosis, decreased
disc protrusion at L4-L5 without definite residual traversing nerve
root compression, and stable to slightly increased right foraminal
stenosis at L4-L5.

## 2024-05-31 ENCOUNTER — Other Ambulatory Visit: Payer: Self-pay | Admitting: Family Medicine

## 2024-05-31 DIAGNOSIS — M5416 Radiculopathy, lumbar region: Secondary | ICD-10-CM

## 2024-06-02 ENCOUNTER — Ambulatory Visit
Admission: RE | Admit: 2024-06-02 | Discharge: 2024-06-02 | Disposition: A | Discharge status: Home / Self Care | Source: Ambulatory Visit | Attending: Family Medicine | Admitting: Family Medicine

## 2024-06-02 DIAGNOSIS — M5416 Radiculopathy, lumbar region: Secondary | ICD-10-CM

## 2024-06-06 ENCOUNTER — Other Ambulatory Visit

## 2024-08-16 DIAGNOSIS — R972 Elevated prostate specific antigen [PSA]: Secondary | ICD-10-CM | POA: Insufficient documentation

## 2024-08-16 NOTE — Progress Notes (Unsigned)
" ° °  08/16/24 1:00 PM   Sean Braun 1955/09/30 [id number]   HPI: 69 y.o. male here for initial evaluation of elevated PSA   PSA 5.14 (08/03/24)  PSA 4.98 (12/13/23)  PSA 4.35 (09/15/23) PSA 4.08 (06/15/23)     PMH: No past medical history on file.  Surgical History:  Family History: No family history on file.  Social History:  has no history on file for tobacco use, alcohol use, and drug use.      Physical Exam: There were no vitals taken for this visit.   Constitutional:  Alert and oriented, No acute distress. Cardiovascular: No clubbing, cyanosis, or edema. Respiratory: Normal respiratory effort, no increased work of breathing. GI: Nondistended GU: *** Skin: No rashes, bruises or suspicious lesions. Neurologic: Grossly intact, no focal deficits, moving all 4 extremities. Psychiatric: Normal mood and affect.  Laboratory Data: Component Ref Range & Units 13 d ago  PSA (Prostate Specific Antigen), Total 0.10 - 4.00 ng/mL 5.14 High    Component Ref Range & Units 8 mo ago  PSA (Prostate Specific Antigen), Total 0.10 - 4.00 ng/mL 4.98 High     Pertinent Imaging: N/A    Assessment & Plan:    Elevated PSA Assessment & Plan: PSA 5.14 (08/03/24)   - from 4-5 baseline since 2024  We discussed the significance of an elevated prostate-specific antigen (PSA) level. PSA is a nonspecific marker and may be elevated due to both benign and malignant causes. Benign factors include benign prostatic hyperplasia (BPH), prostatitis or urinary tract infection, recent ejaculation, catheterization or instrumentation, and advancing age. Malignant causes include prostate cancer of varying risk categories.  We reviewed that a single PSA value is less informative than following PSA trends over time, which can better reflect underlying pathology. Risk factors for prostate cancer include increasing age, family history of prostate cancer, African American race, and known germline  mutations (e.g., BRCA2).  Next steps may include repeating PSA to confirm elevation, consideration of additional biomarkers or PSA derivatives (e.g., %free PSA, PSA density), and obtaining a multiparametric prostate MRI to assess for suspicious lesions. Based on PSA kinetics, risk profile, and MRI findings, a prostate biopsy may be recommended for definitive diagnosis.         Penne Skye, MD 08/16/2024  [facility] [address] [place], [place] [address] [phone] "

## 2024-08-16 NOTE — Assessment & Plan Note (Signed)
 PSA 5.14 (08/03/24)   - from 4-5 baseline since 2024  We discussed the significance of an elevated prostate-specific antigen (PSA) level. PSA is a nonspecific marker and may be elevated due to both benign and malignant causes. Benign factors include benign prostatic hyperplasia (BPH), prostatitis or urinary tract infection, recent ejaculation, catheterization or instrumentation, and advancing age. Malignant causes include prostate cancer of varying risk categories.  We reviewed that a single PSA value is less informative than following PSA trends over time, which can better reflect underlying pathology. Risk factors for prostate cancer include increasing age, family history of prostate cancer, African American race, and known germline mutations (e.g., BRCA2).  Next steps may include repeating PSA to confirm elevation, consideration of additional biomarkers or PSA derivatives (e.g., %free PSA, PSA density), and obtaining a multiparametric prostate MRI to assess for suspicious lesions. Based on PSA kinetics, risk profile, and MRI findings, a prostate biopsy may be recommended for definitive diagnosis.

## 2024-08-23 ENCOUNTER — Ambulatory Visit: Admitting: Urology

## 2024-08-23 DIAGNOSIS — R972 Elevated prostate specific antigen [PSA]: Secondary | ICD-10-CM
